# Patient Record
Sex: Male | Born: 1960 | Race: Black or African American | Hispanic: No | Marital: Married | Smoking: Current every day smoker
Health system: Southern US, Community
[De-identification: ages and names within clinical notes are randomized; demographics above are authoritative.]

## PROBLEM LIST (undated history)

## (undated) DIAGNOSIS — I10 Essential (primary) hypertension: Secondary | ICD-10-CM

## (undated) DIAGNOSIS — E039 Hypothyroidism, unspecified: Secondary | ICD-10-CM

---

## 2010-07-21 ENCOUNTER — Emergency Department (HOSPITAL_COMMUNITY): Admission: EM | Admit: 2010-07-21 | Discharge: 2010-07-22 | Payer: Self-pay | Admitting: Emergency Medicine

## 2014-11-08 ENCOUNTER — Emergency Department (HOSPITAL_COMMUNITY)
Admission: EM | Admit: 2014-11-08 | Discharge: 2014-11-08 | Disposition: A | Discharge status: Home / Self Care | Payer: Non-veteran care | Attending: Emergency Medicine | Admitting: Emergency Medicine

## 2014-11-08 ENCOUNTER — Emergency Department (HOSPITAL_COMMUNITY): Payer: Non-veteran care

## 2014-11-08 ENCOUNTER — Encounter (HOSPITAL_COMMUNITY): Payer: Self-pay | Admitting: Emergency Medicine

## 2014-11-08 ENCOUNTER — Other Ambulatory Visit: Payer: Self-pay

## 2014-11-08 DIAGNOSIS — R079 Chest pain, unspecified: Secondary | ICD-10-CM

## 2014-11-08 DIAGNOSIS — R0602 Shortness of breath: Secondary | ICD-10-CM | POA: Insufficient documentation

## 2014-11-08 DIAGNOSIS — Z720 Tobacco use: Secondary | ICD-10-CM | POA: Insufficient documentation

## 2014-11-08 DIAGNOSIS — Z7982 Long term (current) use of aspirin: Secondary | ICD-10-CM | POA: Insufficient documentation

## 2014-11-08 DIAGNOSIS — Z79899 Other long term (current) drug therapy: Secondary | ICD-10-CM | POA: Insufficient documentation

## 2014-11-08 DIAGNOSIS — E039 Hypothyroidism, unspecified: Secondary | ICD-10-CM | POA: Diagnosis not present

## 2014-11-08 DIAGNOSIS — I10 Essential (primary) hypertension: Secondary | ICD-10-CM | POA: Diagnosis not present

## 2014-11-08 DIAGNOSIS — R0789 Other chest pain: Secondary | ICD-10-CM | POA: Insufficient documentation

## 2014-11-08 HISTORY — DX: Essential (primary) hypertension: I10

## 2014-11-08 HISTORY — DX: Hypothyroidism, unspecified: E03.9

## 2014-11-08 LAB — CBC
HCT: 39.5 % (ref 39.0–52.0)
Hemoglobin: 12.6 g/dL — ABNORMAL LOW (ref 13.0–17.0)
MCH: 27.3 pg (ref 26.0–34.0)
MCHC: 31.9 g/dL (ref 30.0–36.0)
MCV: 85.5 fL (ref 78.0–100.0)
Platelets: 218 10*3/uL (ref 150–400)
RBC: 4.62 MIL/uL (ref 4.22–5.81)
RDW: 12.7 % (ref 11.5–15.5)
WBC: 5.7 10*3/uL (ref 4.0–10.5)

## 2014-11-08 LAB — BASIC METABOLIC PANEL
Anion gap: 7 (ref 5–15)
BUN: 11 mg/dL (ref 6–23)
CALCIUM: 8.5 mg/dL (ref 8.4–10.5)
CHLORIDE: 104 mmol/L (ref 96–112)
CO2: 27 mmol/L (ref 19–32)
Creatinine, Ser: 1.14 mg/dL (ref 0.50–1.35)
GFR calc Af Amer: 83 mL/min — ABNORMAL LOW (ref >90)
GFR, EST NON AFRICAN AMERICAN: 71 mL/min — AB (ref >90)
Glucose, Bld: 148 mg/dL — ABNORMAL HIGH (ref 70–99)
Potassium: 3.5 mmol/L (ref 3.5–5.1)
SODIUM: 138 mmol/L (ref 135–145)

## 2014-11-08 LAB — RAPID HIV SCREEN (WH-MAU): SUDS RAPID HIV SCREEN: NONREACTIVE

## 2014-11-08 LAB — D-DIMER, QUANTITATIVE: D-Dimer, Quant: <0.27 ug/mL-FEU (ref 0.00–0.48)

## 2014-11-08 LAB — I-STAT TROPONIN, ED: TROPONIN I, POC: 0 ng/mL (ref 0.00–0.08)

## 2014-11-08 LAB — BRAIN NATRIURETIC PEPTIDE: B Natriuretic Peptide: 13.9 pg/mL (ref 0.0–100.0)

## 2014-11-08 NOTE — ED Notes (Signed)
Pt up ambulatory to the bathroom at this time 

## 2014-11-08 NOTE — ED Provider Notes (Signed)
CSN: [id number]     Arrival date & time 11/08/14  0429 History   First MD Initiated Contact with Patient 11/08/14 0505     Chief Complaint  Patient presents with  . Chest Pain     (Consider location/radiation/quality/duration/timing/severity/associated sxs/prior Treatment) HPI Patient presents with aching substernal chest pain that woke him from sleep around midnight. Pain is worse with deep inspiration and movement. Associated with shortness of breath. Denies cough. No fever or chills. Patient states he took 2 aspirin at home with no relief. He called EMS and was given nitroglycerin and 2 more baby aspirin en route. Chest pain resolved completely. He is now symptom-free rating his pain 0 of 10. He denies any recent extended travel or surgeries. He has no lower extremity swelling or pain. He has no family history of coronary artery disease or thrombotic embolic disease. Past Medical History  Diagnosis Date  . Hypothyroidism   . Hypertension    History reviewed. No pertinent past surgical history. No family history on file. History  Substance Use Topics  . Smoking status: Current Every Day Smoker    Types: Cigarettes  . Smokeless tobacco: Not on file  . Alcohol Use: No    Review of Systems  Constitutional: Negative for fever and chills.  Respiratory: Positive for shortness of breath. Negative for cough.   Cardiovascular: Positive for chest pain. Negative for palpitations and leg swelling.  Gastrointestinal: Negative for nausea, vomiting and abdominal pain.  Musculoskeletal: Negative for back pain, neck pain and neck stiffness.  Skin: Negative for rash and wound.  Neurological: Negative for dizziness, weakness, light-headedness, numbness and headaches.  All other systems reviewed and are negative.     Allergies  Review of patient's allergies indicates no known allergies.  Home Medications   Prior to Admission medications   Medication Sig Start Date End Date Taking?  Authorizing Provider  aspirin EC 81 MG tablet Take 162 mg by mouth once.   Yes Historical Provider, MD  levothyroxine (SYNTHROID, LEVOTHROID) 125 MCG tablet Take 125 mcg by mouth daily before breakfast.   Yes Historical Provider, MD   BP 148/99 mmHg  Pulse 72  Temp(Src) 98.1 F (36.7 C) (Oral)  Resp 17  Ht  (1.6 m)  Wt 203 lb (92.08 kg)  BMI 35.97 kg/m2  SpO2 98% Physical Exam  Constitutional: He is oriented to person, place, and time. He appears well-developed and well-nourished. No distress.  HENT:  Head: Normocephalic and atraumatic.  Mouth/Throat: Oropharynx is clear and moist.  Eyes: EOM are normal. Pupils are equal, round, and reactive to light.  Neck: Normal range of motion. Neck supple.  Cardiovascular: Normal rate and regular rhythm.   Pulmonary/Chest: Effort normal and breath sounds normal. No respiratory distress. He has no wheezes. He has no rales. He exhibits no tenderness.  Abdominal: Soft. Bowel sounds are normal. He exhibits no distension and no mass. There is no tenderness. There is no rebound and no guarding.  Musculoskeletal: Normal range of motion. He exhibits no edema or tenderness.  No calf swelling or tenderness.  Neurological: He is alert and oriented to person, place, and time.  Skin: Skin is warm and dry. No rash noted. No erythema.  Psychiatric: He has a normal mood and affect. His behavior is normal.  Nursing note and vitals reviewed.   ED Course  Procedures (including critical care time) Labs Review Labs Reviewed  CBC - Abnormal; Notable for the following:    Hemoglobin 12.6 (*)  All other components within normal limits  BASIC METABOLIC PANEL - Abnormal; Notable for the following:    Glucose, Bld 148 (*)    GFR calc non Af Amer 71 (*)    GFR calc Af Amer 83 (*)    All other components within normal limits  BRAIN NATRIURETIC PEPTIDE  D-DIMER, QUANTITATIVE  RAPID HIV SCREEN ([facility])  HEPATITIS PANEL, ACUTE  I-STAT TROPOININ, ED   Rosezena SensorI-STAT TROPOININ, ED    Imaging Review Dg Chest 2 View  11/08/2014   CLINICAL DATA:  Chest pain, nausea, diaphoresis, shortness of breath.  EXAM: CHEST  2 VIEW  COMPARISON:  Chest radiograph July 21, 2010  FINDINGS: Cardiac silhouette is mildly enlarged, unchanged. Re- demonstration of RIGHT paratracheal mass with curvilinear peripheral faint probable calcification. Tortuous calcified aorta knob. LEFT lung base strandy densities out plural effusion. No pneumothorax. Soft tissue planes and included osseous structures are nonsuspicious.  IMPRESSION: Mild cardiomegaly.  LEFT lung base atelectasis.  Stable appearance of nonspecific possibly calcified RIGHT paratracheal mass for which CT of the chest with contrast is again recommended, on a nonemergent basis.   Electronically Signed   By: Awilda Metroourtnay  Bloomer   On: 11/08/2014 06:20     EKG Interpretation   Date/Time:  Sunday November 08 2014 03:32:38 EDT Ventricular Rate:  70 PR Interval:  153 QRS Duration: 90 QT Interval:  395 QTC Calculation: 426 R Axis:   70 Text Interpretation:  Age not entered, assumed to be  54 years old for  purpose of ECG interpretation Sinus rhythm Borderline T wave abnormalities  Confirmed by Ranae PalmsYELVERTON  MD, Raelle Chambers ([id number]) on 11/08/2014 7:05:01 AM      MDM   Final diagnoses:  Chest pain, unspecified chest pain type    Initial troponin is normal. Patient remains chest pain-free. Will need repeat 3 hour delta troponin. Discussed possible cardiology follow-up if delta troponin is normal. Patient is in agreement with plan.  Discuss with cardiology PA and will set up outpatient follow-up appointment.  Signed out to Dr. Rhunette CroftNanavati  pending repeat troponin.  Loren Raceravid Anton Cheramie, MD 11/10/14 [phone]

## 2014-11-08 NOTE — ED Notes (Signed)
Pt arrives from home with sudden onset chest pain, states no hx of the same, sudden onset central tightness, 10/10. EMS gave 1 SL nitro, dropped pt's  BP from 150/90 tp 90/60. 91% on RA, 95% on 2L. Initially SHOB and nausea, all symptoms gone at this time. 0/10 pain

## 2014-11-08 NOTE — Discharge Instructions (Signed)

## 2014-11-09 LAB — HEPATITIS PANEL, ACUTE
HCV AB: NEGATIVE
HEP A IGM: NONREACTIVE
HEP B S AG: NEGATIVE
Hep B C IgM: NONREACTIVE

## 2014-11-09 LAB — I-STAT TROPONIN, ED: TROPONIN I, POC: 0 ng/mL (ref 0.00–0.08)

## 2014-11-11 ENCOUNTER — Ambulatory Visit: Payer: Non-veteran care | Admitting: Cardiology

## 2014-11-25 ENCOUNTER — Encounter: Payer: Self-pay | Admitting: Cardiology

## 2016-11-12 IMAGING — CR DG CHEST 2V
2 series · 2 of 2 positions shown · non-contrast
Comparison: Chest radiograph July 21, 2010

CLINICAL DATA: Chest pain, nausea, diaphoresis, shortness of
breath.

EXAM:
CHEST  2 VIEW

[chest pa]
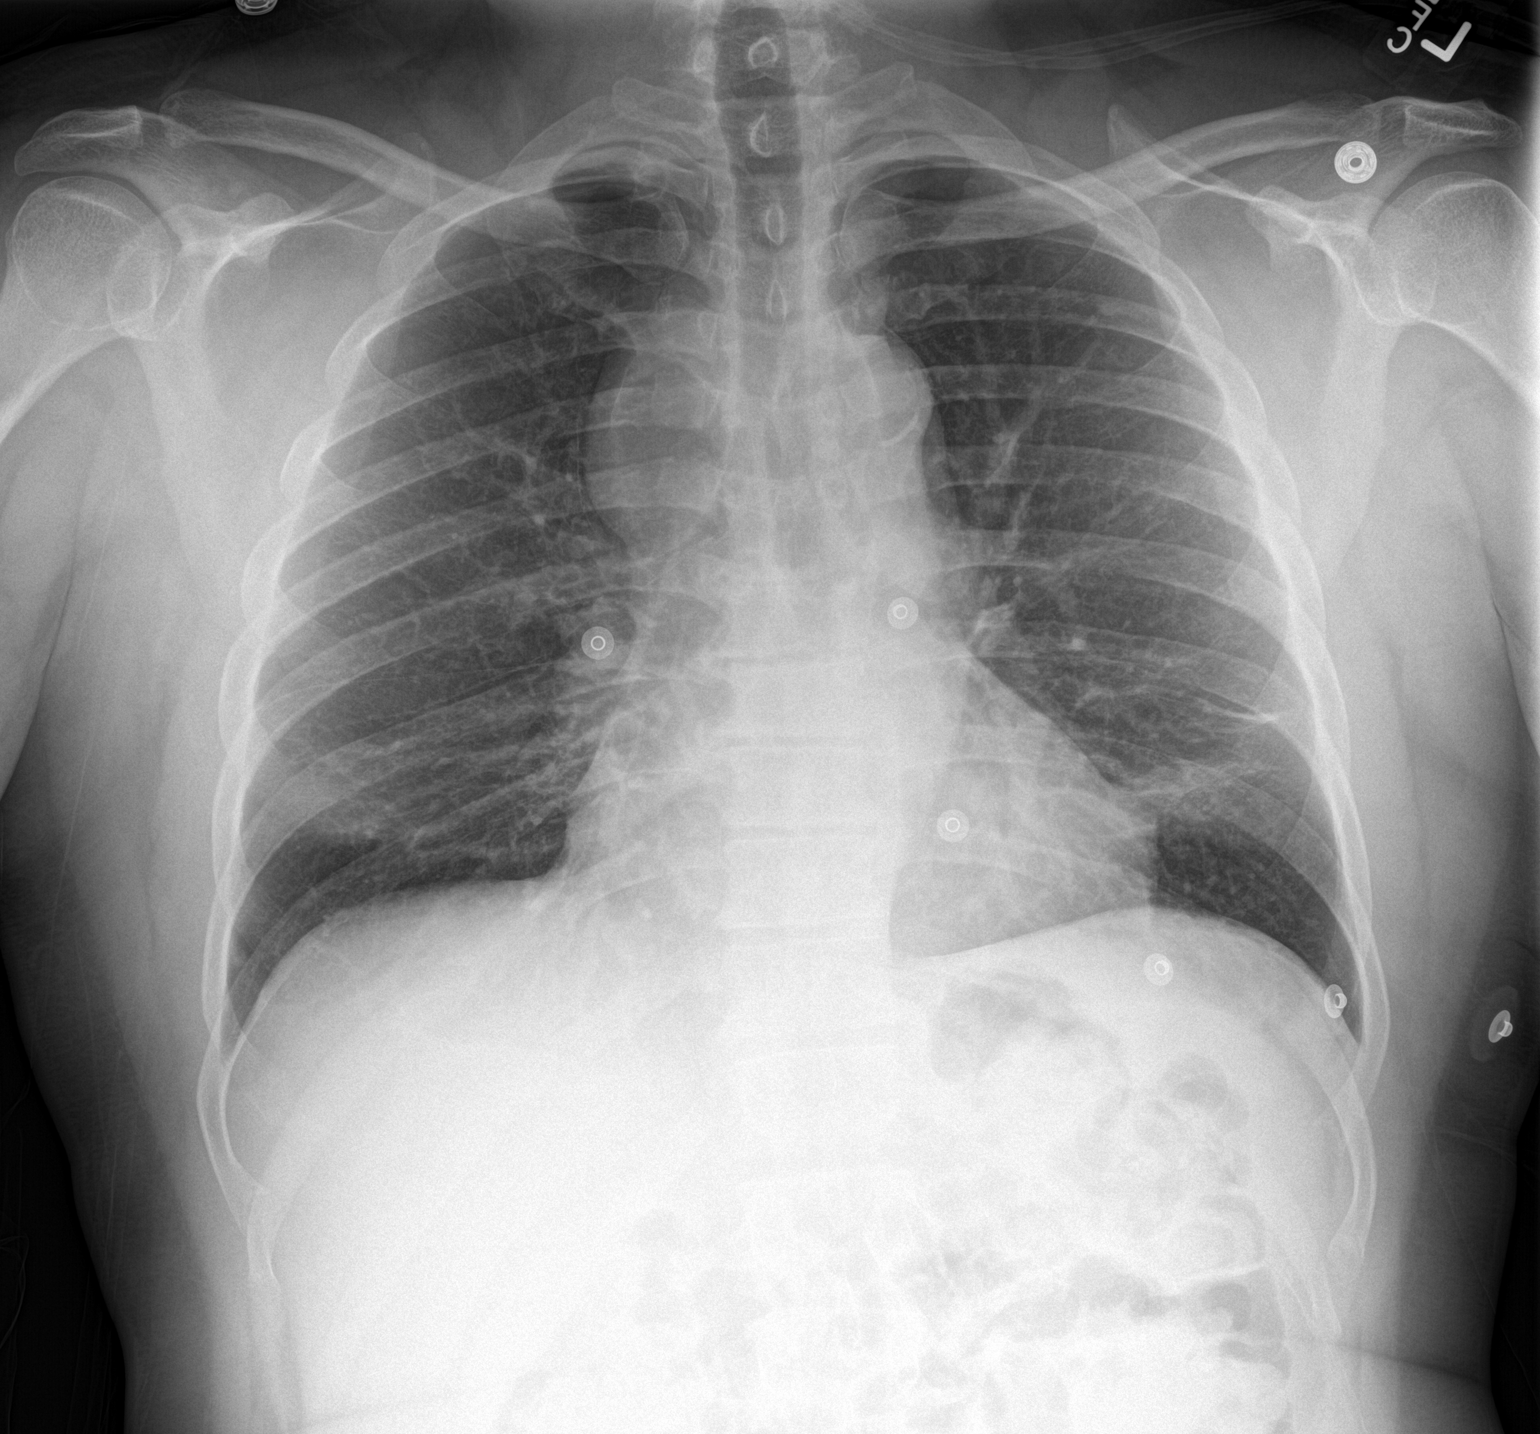

[chest lat]
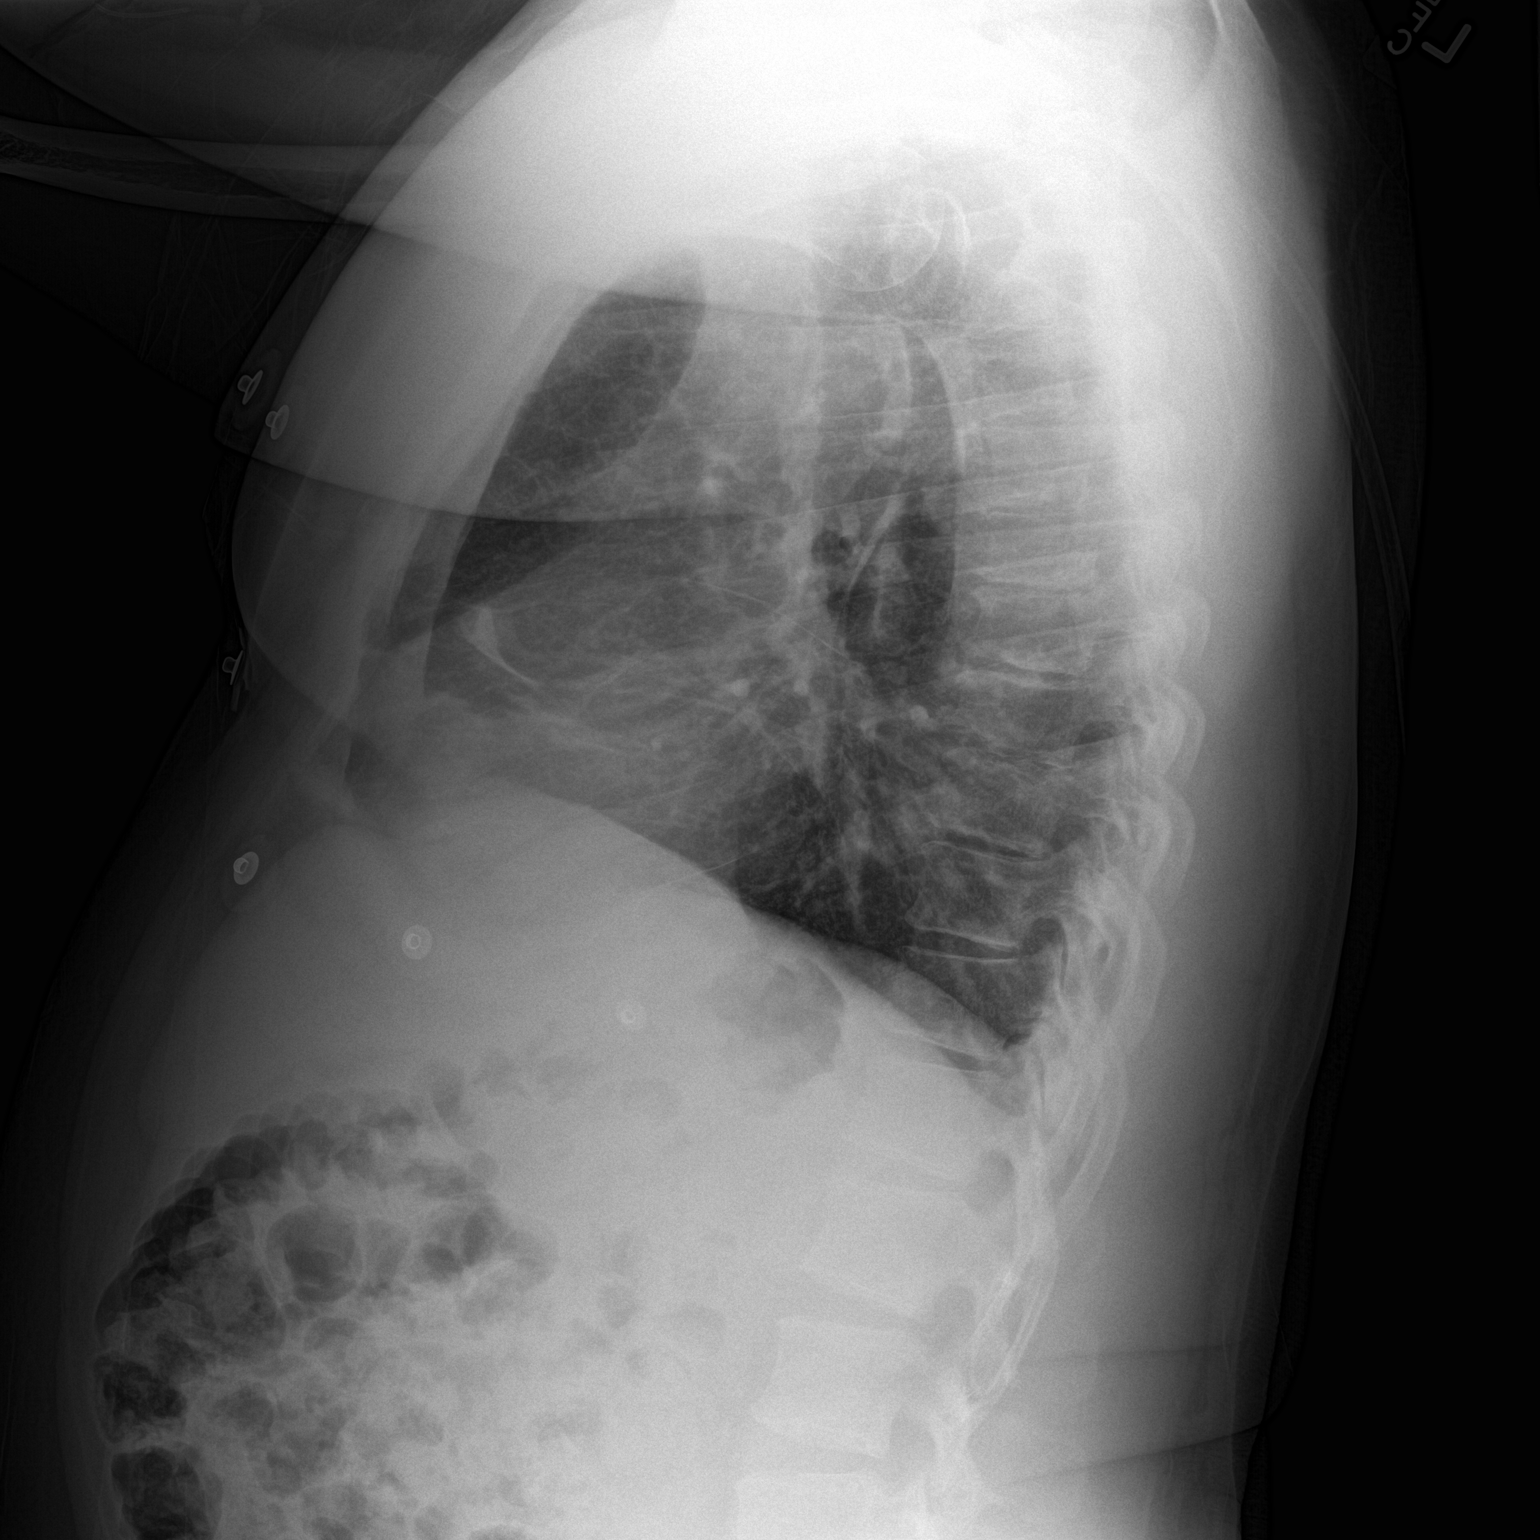

[2 of 2 positions shown; findings below may reference images not displayed]

FINDINGS: Cardiac silhouette is mildly enlarged, unchanged. Re- demonstration
of RIGHT paratracheal mass with curvilinear peripheral faint
probable calcification. Tortuous calcified aorta knob. LEFT lung
base strandy densities out plural effusion. No pneumothorax. Soft
tissue planes and included osseous structures are nonsuspicious.
IMPRESSION: Mild cardiomegaly.  LEFT lung base atelectasis.

Stable appearance of nonspecific possibly calcified RIGHT
paratracheal mass for which CT of the chest with contrast is again
recommended, on a nonemergent basis.

  By: Ferienhaus Erxleben
